# Patient Record
Sex: Female | Born: 1965 | Race: White | Hispanic: No | Marital: Married | Smoking: Former smoker
Health system: Southern US, Community
[De-identification: ages and names within clinical notes are randomized; demographics above are authoritative.]

## PROBLEM LIST (undated history)

## (undated) DIAGNOSIS — E7800 Pure hypercholesterolemia, unspecified: Secondary | ICD-10-CM

## (undated) DIAGNOSIS — J029 Acute pharyngitis, unspecified: Secondary | ICD-10-CM

## (undated) DIAGNOSIS — K219 Gastro-esophageal reflux disease without esophagitis: Secondary | ICD-10-CM

## (undated) HISTORY — DX: Pure hypercholesterolemia, unspecified: E78.00

## (undated) HISTORY — DX: Acute pharyngitis, unspecified: J02.9

## (undated) HISTORY — DX: Gastro-esophageal reflux disease without esophagitis: K21.9

---

## 2007-01-23 ENCOUNTER — Emergency Department (HOSPITAL_COMMUNITY): Admission: EM | Admit: 2007-01-23 | Discharge: 2007-01-23 | Payer: Self-pay | Admitting: Emergency Medicine
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
## 2016-08-26 ENCOUNTER — Encounter: Payer: Self-pay | Admitting: *Deleted

## 2016-08-27 ENCOUNTER — Ambulatory Visit (INDEPENDENT_AMBULATORY_CARE_PROVIDER_SITE_OTHER): Payer: BLUE CROSS/BLUE SHIELD | Admitting: Cardiology

## 2016-08-27 ENCOUNTER — Encounter: Payer: Self-pay | Admitting: Cardiology

## 2016-08-27 VITALS — BP 108/72 | HR 64 | Ht 61.0 in | Wt 193.0 lb

## 2016-08-27 DIAGNOSIS — R0789 Other chest pain: Secondary | ICD-10-CM | POA: Diagnosis not present

## 2016-08-27 DIAGNOSIS — R0602 Shortness of breath: Secondary | ICD-10-CM

## 2016-08-27 MED ORDER — ASPIRIN EC 81 MG PO TBEC: 81.0000 mg | DELAYED_RELEASE_TABLET | Freq: Every day | ORAL | Status: AC

## 2016-08-27 NOTE — Patient Instructions (Signed)
Medication Instructions:   Begin Aspirin 81mg daily.  Continue all other medications.    Labwork: none  Testing/Procedures:  Your physician has requested that you have an echocardiogram. Echocardiography is a painless test that uses sound waves to create images of your heart. It provides your doctor with information about the size and shape of your heart and how well your heart's chambers and valves are working. This procedure takes approximately one hour. There are no restrictions for this procedure.  Office will contact with results via phone or letter.    Follow-Up: Pending test results.   Any Other Special Instructions Will Be Listed Below (If Applicable).  If you need a refill on your cardiac medications before your next appointment, please call your pharmacy.  

## 2016-08-27 NOTE — Progress Notes (Signed)
Clinical Summary Katherine Duncan is a 50 y.o.female seen as a new patient, she is referred by Dr. Sherril CroonVyas  1. Chest pain - started in August. Sharp pain right side, 4/10 in severity. Often occurred with getting angry or anxious. Felt flush. No SOB no palpitations. Pain lasts for just a few seconds. 1-2 episodes per week. Last episode early last week - has had some DOE. For example walking 1.5 miles around her neighborhood. Used to be able to walk without stopping, now has to stop half way due to SOB.  - Can have some occasional LE edema, occasionally in hands as well. Has has had some weight gain she attributes to stopping smoking. No coughing or wheezing.  - after increase in lopressor chest pain symptoms have improved  CAD risk factors: HTN, borderline hyperlipidemia (had been on simvastatin), former smoker, father MI later 6840s.   2. HTN - followed by pcp.    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   Past Medical History:  Diagnosis Date  . Elevated cholesterol   . GERD (gastroesophageal reflux disease)   . Pharyngitis      Allergies  Allergen Reactions  . Bactrim [Sulfamethoxazole-Trimethoprim] Other (See Comments)    Chest pain   . Vicodin [Hydrocodone-Acetaminophen] Nausea Only  . Amoxicillin Rash     Current Outpatient Prescriptions  Medication Sig Dispense Refill  . azelastine (ASTELIN) 0.1 % nasal spray Place into both nostrils as needed for rhinitis. Use in each nostril as directed    . metoprolol tartrate (LOPRESSOR) 25 MG tablet Take 25 mg by mouth. Take 2 tabs (50mg ) by mouth every morning & 1 tab (25mg ) every evening    . ranitidine (ZANTAC) 150 MG tablet Take 150 mg by mouth.     No current facility-administered medications for this visit.      No past surgical history on file.   Allergies  Allergen Reactions  . Bactrim [Sulfamethoxazole-Trimethoprim] Other (See Comments)    Chest pain   . Vicodin [Hydrocodone-Acetaminophen] Nausea Only    . Amoxicillin Rash      No family history on file.   Social History Katherine Duncan reports that she has been smoking Cigarettes.  She does not have any smokeless tobacco history on file. Katherine Duncan has no alcohol history on file.   Review of Systems CONSTITUTIONAL: No weight loss, fever, chills, weakness or fatigue.  HEENT: Eyes: No visual loss, blurred vision, double vision or yellow sclerae.No hearing loss, sneezing, congestion, runny nose or sore throat.  SKIN: No rash or itching.  CARDIOVASCULAR:  RESPIRATORY: No shortness of breath, cough or sputum.  GASTROINTESTINAL: No anorexia, nausea, vomiting or diarrhea. No abdominal pain or blood.  GENITOURINARY: No burning on urination, no polyuria NEUROLOGICAL: No headache, dizziness, syncope, paralysis, ataxia, numbness or tingling in the extremities. No change in bowel or bladder control.  MUSCULOSKELETAL: No muscle, back pain, joint pain or stiffness.  LYMPHATICS: No enlarged nodes. No history of splenectomy.  PSYCHIATRIC: No history of depression or anxiety.  ENDOCRINOLOGIC: No reports of sweating, cold or heat intolerance. No polyuria or polydipsia.  Marland Kitchen.   Physical Examination There were no vitals filed for this visit. There were no vitals filed for this visit.  Gen: resting comfortably, no acute distress HEENT: no scleral icterus, pupils equal round and reactive, no palptable cervical adenopathy,  CV Resp: Clear to auscultation bilaterally GI: abdomen is soft, non-tender, non-distended, normal bowel sounds, no hepatosplenomegaly MSK: extremities are warm, no  edema.  Skin: warm, no rash Neuro:  no focal deficits Psych: appropriate affect   Diagnostic Studies     Assessment and Plan        Antoine Poche, M.D., F.A.C.C.      Clinical Summary Katherine Duncan is a 50 y.o.female   Past Medical History:  Diagnosis Date  . Elevated cholesterol   . GERD (gastroesophageal reflux disease)   . Pharyngitis       Allergies  Allergen Reactions  . Bactrim [Sulfamethoxazole-Trimethoprim] Other (See Comments)    Chest pain   . Vicodin [Hydrocodone-Acetaminophen] Nausea Only  . Amoxicillin Rash     Current Outpatient Prescriptions  Medication Sig Dispense Refill  . azelastine (ASTELIN) 0.1 % nasal spray Place into both nostrils as needed for rhinitis. Use in each nostril as directed    . metoprolol tartrate (LOPRESSOR) 25 MG tablet Take 2 tabs (50mg ) by mouth every morning & 1 tab (25mg ) every evening     . pantoprazole (PROTONIX) 40 MG tablet Take 1 tablet by mouth daily.  1  . aspirin EC 81 MG tablet Take 1 tablet (81 mg total) by mouth daily.     No current facility-administered medications for this visit.      No past surgical history on file.   Allergies  Allergen Reactions  . Bactrim [Sulfamethoxazole-Trimethoprim] Other (See Comments)    Chest pain   . Vicodin [Hydrocodone-Acetaminophen] Nausea Only  . Amoxicillin Rash      Family history Father with CAD, had MI in his late 38s.    Social History Katherine Duncan reports that she quit smoking about 8 months ago. Her smoking use included Cigarettes. She started smoking about 32 years ago. She has a 23.25 pack-year smoking history. She has never used smokeless tobacco. Katherine Duncan has no alcohol history on file.   Review of Systems CONSTITUTIONAL: No weight loss, fever, chills, weakness or fatigue.  HEENT: Eyes: No visual loss, blurred vision, double vision or yellow sclerae.No hearing loss, sneezing, congestion, runny nose or sore throat.  SKIN: No rash or itching.  CARDIOVASCULAR: per HPI RESPIRATORY: per HPI  GASTROINTESTINAL: No anorexia, nausea, vomiting or diarrhea. No abdominal pain or blood.  GENITOURINARY: No burning on urination, no polyuria NEUROLOGICAL: No headache, dizziness, syncope, paralysis, ataxia, numbness or tingling in the extremities. No change in bowel or bladder control.  MUSCULOSKELETAL: No muscle,  back pain, joint pain or stiffness.  LYMPHATICS: No enlarged nodes. No history of splenectomy.  PSYCHIATRIC: No history of depression or anxiety.  ENDOCRINOLOGIC: No reports of sweating, cold or heat intolerance. No polyuria or polydipsia.  Marland Kitchen   Physical Examination Vitals:   08/27/16 1003  BP: 108/72  Pulse: 64   Filed Weights   08/27/16 1003  Weight: 193 lb (87.5 kg)    Gen: resting comfortably, no acute distress HEENT: no scleral icterus, pupils equal round and reactive, no palptable cervical adenopathy,  CV: RRR, no m/r/g, no jvd Resp: Clear to auscultation bilaterally GI: abdomen is soft, non-tender, non-distended, normal bowel sounds, no hepatosplenomegaly MSK: extremities are warm, no edema.  Skin: warm, no rash Neuro:  no focal deficits Psych: appropriate affect     Assessment and Plan  1. Chest pain - recent chest pain and DOE. She has multiple CAD risk factors. Normal EKG at baseline.  - will obtain echo, pending results likely obtain GXT. She would need to hold beta blocker for GXT - start ASA 81mg  daily for now.   2. HTN - at  goal, continue current meds   F/u pending test results. Likely GXT pending echo results.       Antoine Poche, M.D.

## 2016-09-17 ENCOUNTER — Ambulatory Visit (INDEPENDENT_AMBULATORY_CARE_PROVIDER_SITE_OTHER): Payer: BLUE CROSS/BLUE SHIELD

## 2016-09-17 ENCOUNTER — Other Ambulatory Visit: Payer: Self-pay

## 2016-09-17 DIAGNOSIS — R0602 Shortness of breath: Secondary | ICD-10-CM

## 2016-09-23 ENCOUNTER — Encounter: Payer: Self-pay | Admitting: *Deleted

## 2016-09-23 ENCOUNTER — Telehealth: Payer: Self-pay | Admitting: *Deleted

## 2016-09-23 DIAGNOSIS — R079 Chest pain, unspecified: Secondary | ICD-10-CM

## 2016-09-23 NOTE — Telephone Encounter (Signed)
Pt aware of 10/13 @ 8:30 am [facility]  GXT and to hold metoprolol the day of test  - verbalized understanding of instructions.

## 2016-09-23 NOTE — Telephone Encounter (Signed)
Pt aware and agreeable to GXT. Will place orders and forward to schedulers.

## 2016-09-23 NOTE — Telephone Encounter (Signed)
-----   Message from Antoine PocheJonathan F Branch, MD sent at 09/22/2016 10:27 AM EDT ----- Echo overall looks good, normal heart function. I'd like to do a gxt to evaluate for blockages Hold metoprolol day of test Dominga FerryJ Branch MD

## 2016-09-26 ENCOUNTER — Ambulatory Visit (HOSPITAL_COMMUNITY)
Admission: RE | Admit: 2016-09-26 | Discharge: 2016-09-26 | Disposition: A | Discharge status: Home / Self Care | Payer: BLUE CROSS/BLUE SHIELD | Source: Ambulatory Visit | Attending: Cardiology | Admitting: Cardiology

## 2016-09-26 DIAGNOSIS — R079 Chest pain, unspecified: Secondary | ICD-10-CM | POA: Diagnosis present

## 2016-09-26 DIAGNOSIS — I10 Essential (primary) hypertension: Secondary | ICD-10-CM | POA: Diagnosis not present

## 2016-09-26 LAB — EXERCISE TOLERANCE TEST
CHL CUP MPHR: 171 {beats}/min
CHL CUP RESTING HR STRESS: 65 {beats}/min
CSEPPHR: 150 {beats}/min
Estimated workload: 4.6 METS
Exercise duration (min): 2 min
Exercise duration (sec): 23 s
Percent HR: 87 %
RPE: 15

## 2016-09-30 ENCOUNTER — Encounter: Payer: Self-pay | Admitting: *Deleted

## 2016-09-30 ENCOUNTER — Telehealth: Payer: Self-pay | Admitting: *Deleted

## 2016-09-30 DIAGNOSIS — R079 Chest pain, unspecified: Secondary | ICD-10-CM

## 2016-09-30 NOTE — Telephone Encounter (Signed)
-----   Message from Lesle ChrisAngela G Hill, LPN sent at 16/10/960410/16/2017  1:47 PM EDT -----   ----- Message ----- From: Antoine PocheJonathan F Branch, MD Sent: 09/29/2016  12:56 PM To: Lesle ChrisAngela G Hill, LPN  Stress test results are borderline, I'd like to repeat a more detailed stress test to get more information. Please order a lexiscan for chest pain, does not need to hold any meds.   Dominga FerryJ Branch MD

## 2016-09-30 NOTE — Telephone Encounter (Signed)
Pt agreeable to lexiscan - placed orders and forward to schedulers

## 2016-10-02 ENCOUNTER — Telehealth: Payer: Self-pay | Admitting: Cardiology

## 2016-10-02 NOTE — Telephone Encounter (Signed)
Mrs. Katherine Duncan called requesting that we cancel her stress test for 10/06/16 .  Her new insurance will go into effect on October 15, 2016 and she will bring card by the office. She will re-schedule her test after November 1,2017.

## 2016-10-02 NOTE — Telephone Encounter (Signed)
Noted  

## 2016-10-06 ENCOUNTER — Encounter (HOSPITAL_COMMUNITY): Payer: BLUE CROSS/BLUE SHIELD

## 2016-10-06 ENCOUNTER — Inpatient Hospital Stay (HOSPITAL_COMMUNITY): Admission: RE | Admit: 2016-10-06 | Payer: BLUE CROSS/BLUE SHIELD | Source: Ambulatory Visit

## 2016-10-08 ENCOUNTER — Encounter (HOSPITAL_COMMUNITY): Payer: BLUE CROSS/BLUE SHIELD

## 2019-11-23 ENCOUNTER — Other Ambulatory Visit: Payer: Self-pay

## 2019-11-23 DIAGNOSIS — Z20822 Contact with and (suspected) exposure to covid-19: Secondary | ICD-10-CM

## 2019-11-24 LAB — NOVEL CORONAVIRUS, NAA: SARS-CoV-2, NAA: NOT DETECTED

## 2020-08-31 ENCOUNTER — Ambulatory Visit
Admission: EM | Admit: 2020-08-31 | Discharge: 2020-08-31 | Disposition: A | Discharge status: Home / Self Care | Payer: BC Managed Care – PPO

## 2020-08-31 ENCOUNTER — Other Ambulatory Visit: Payer: Self-pay

## 2020-08-31 DIAGNOSIS — R0981 Nasal congestion: Secondary | ICD-10-CM

## 2020-08-31 DIAGNOSIS — H9203 Otalgia, bilateral: Secondary | ICD-10-CM | POA: Diagnosis not present

## 2020-08-31 DIAGNOSIS — R059 Cough, unspecified: Secondary | ICD-10-CM

## 2020-08-31 MED ORDER — PREDNISONE 20 MG PO TABS: 20.0000 mg | ORAL_TABLET | Freq: Two times a day (BID) | ORAL | 0 refills | Status: AC

## 2020-08-31 NOTE — ED Provider Notes (Signed)
[facility]   [id number] 08/31/20 Arrival Time: 0920   CC: COVID symptoms  SUBJECTIVE: History from: patient.  Katherine Duncan is a 54 y.o. female who presents with bilateral ear pain, sinus pain/ pressure, and cough x 1 week.  Denies sick exposure to COVID, flu or strep.  Has tried OTC medications without relief.  Denies aggravating factors.  Reports previous symptoms in the past.   Denies fever, chills, rhinorrhea, sore throat, SOB, wheezing, chest pain, nausea, changes in bowel or bladder habits.    ROS: As per HPI.  All other pertinent ROS negative.     Past Medical History:  Diagnosis Date  . Elevated cholesterol   . GERD (gastroesophageal reflux disease)   . Pharyngitis    History reviewed. No pertinent surgical history. Allergies  Allergen Reactions  . Bactrim [Sulfamethoxazole-Trimethoprim] Other (See Comments)    Chest pain   . Vicodin [Hydrocodone-Acetaminophen] Nausea Only  . Amoxicillin Rash   No current facility-administered medications on file prior to encounter.   Current Outpatient Medications on File Prior to Encounter  Medication Sig Dispense Refill  . aspirin EC 81 MG tablet Take 1 tablet (81 mg total) by mouth daily.    Marland Kitchen atorvastatin (LIPITOR) 20 MG tablet Take by mouth.    Marland Kitchen azelastine (ASTELIN) 0.1 % nasal spray Place into both nostrils as needed for rhinitis. Use in each nostril as directed    . hydrochlorothiazide (MICROZIDE) 12.5 MG capsule Take 12.5 mg by mouth every morning.    . metoprolol tartrate (LOPRESSOR) 25 MG tablet Take 2 tabs (50mg ) by mouth every morning & 1 tab (25mg ) every evening     . pantoprazole (PROTONIX) 40 MG tablet Take 1 tablet by mouth daily.  1   Social History   Socioeconomic History  . Marital status: Married    Spouse name: Not on file  . Number of children: Not on file  . Years of education: Not on file  . Highest education level: Not on file  Occupational History  . Not on file  Tobacco Use  .  Smoking status: Former Smoker    Packs/day: 0.75    Years: 31.00    Pack years: 23.25    Types: Cigarettes    Start date: 03/14/1984    Quit date: 12/12/2015    Years since quitting: 4.7  . Smokeless tobacco: Never Used  Substance and Sexual Activity  . Alcohol use: Not on file  . Drug use: Not on file  . Sexual activity: Not on file  Other Topics Concern  . Not on file  Social History Narrative  . Not on file   Social Determinants of Health   Financial Resource Strain:   . Difficulty of Paying Living Expenses: Not on file  Food Insecurity:   . Worried About 03/16/1984 in the Last Year: Not on file  . Ran Out of Food in the Last Year: Not on file  Transportation Needs:   . Lack of Transportation (Medical): Not on file  . Lack of Transportation (Non-Medical): Not on file  Physical Activity:   . Days of Exercise per Week: Not on file  . Minutes of Exercise per Session: Not on file  Stress:   . Feeling of Stress : Not on file  Social Connections:   . Frequency of Communication with Friends and Family: Not on file  . Frequency of Social Gatherings with Friends and Family: Not on file  . Attends Religious Services: Not on  file  . Active Member of Clubs or Organizations: Not on file  . Attends Banker Meetings: Not on file  . Marital Status: Not on file  Intimate Partner Violence:   . Fear of Current or Ex-Partner: Not on file  . Emotionally Abused: Not on file  . Physically Abused: Not on file  . Sexually Abused: Not on file   History reviewed. No pertinent family history.  OBJECTIVE:  Vitals:   08/31/20 0938  BP: 111/72  Pulse: 61  Resp: 18  Temp: 98.5 F (36.9 C)  SpO2: 96%     General appearance: alert; appears mildly fatigued, but nontoxic; speaking in full sentences and tolerating own secretions HEENT: NCAT; Ears: EACs clear, TMs pearly gray; Eyes: PERRL.  EOM grossly intact.  Nose: nares patent without rhinorrhea, Throat: oropharynx  clear, tonsils non erythematous or enlarged, uvula midline  Neck: supple without LAD Lungs: unlabored respirations, symmetrical air entry; cough: absent; no respiratory distress; CTAB Heart: regular rate and rhythm.   Skin: warm and dry Psychological: alert and cooperative; normal mood and affect   ASSESSMENT & PLAN:  1. Acute ear pain, bilateral   2. Sinus congestion   3. Cough     Meds ordered this encounter  Medications  . predniSONE (DELTASONE) 20 MG tablet    Sig: Take 1 tablet (20 mg total) by mouth 2 (two) times daily with a meal for 5 days.    Dispense:  10 tablet    Refill:  0    Order Specific Question:   Supervising Provider    Answer:   Eustace Moore [[id number]]    Declines COVID test Get plenty of rest and push fluids Prednisone for sinus congestion which may be contribute to ear symptoms Use OTC zyrtec for nasal congestion, runny nose, and/or sore throat Use OTC flonase for nasal congestion and runny nose Use medications daily for symptom relief Use OTC medications like ibuprofen or tylenol as needed fever or pain Call or go to the ED if you have any new or worsening symptoms such as fever, worsening cough, shortness of breath, chest tightness, chest pain, turning blue, changes in mental status, etc...   Reviewed expectations re: course of current medical issues. Questions answered. Outlined signs and symptoms indicating need for more acute intervention. Patient verbalized understanding. After Visit Summary given.         Rennis Harding, PA-C 08/31/20 [phone]

## 2020-08-31 NOTE — Discharge Instructions (Signed)
Declines COVID test Get plenty of rest and push fluids Prednisone for sinus congestion which may be contribute to ear symptoms Use OTC zyrtec for nasal congestion, runny nose, and/or sore throat Use OTC flonase for nasal congestion and runny nose Use medications daily for symptom relief Use OTC medications like ibuprofen or tylenol as needed fever or pain Call or go to the ED if you have any new or worsening symptoms such as fever, worsening cough, shortness of breath, chest tightness, chest pain, turning blue, changes in mental status, etc..Marland Kitchen

## 2020-08-31 NOTE — ED Triage Notes (Signed)
Pt presents with cough for past week and has developed ear pain
